# Patient Record
Sex: Male | Born: 1950 | Hispanic: No | Marital: Married
Health system: Southern US, Community
[De-identification: ages and names within clinical notes are randomized; demographics above are authoritative.]

---

## 1999-02-23 ENCOUNTER — Observation Stay (HOSPITAL_COMMUNITY): Admission: RE | Admit: 1999-02-23 | Discharge: 1999-02-24 | Payer: Self-pay | Admitting: Orthopedic Surgery

## 2003-12-02 ENCOUNTER — Ambulatory Visit (HOSPITAL_COMMUNITY): Admission: RE | Admit: 2003-12-02 | Discharge: 2003-12-03 | Payer: Self-pay | Admitting: Neurological Surgery

## 2019-09-21 DIAGNOSIS — Z6836 Body mass index (BMI) 36.0-36.9, adult: Secondary | ICD-10-CM | POA: Diagnosis not present

## 2019-09-21 DIAGNOSIS — Z1331 Encounter for screening for depression: Secondary | ICD-10-CM | POA: Diagnosis not present

## 2019-09-21 DIAGNOSIS — Z125 Encounter for screening for malignant neoplasm of prostate: Secondary | ICD-10-CM | POA: Diagnosis not present

## 2019-09-21 DIAGNOSIS — E785 Hyperlipidemia, unspecified: Secondary | ICD-10-CM | POA: Diagnosis not present

## 2019-09-21 DIAGNOSIS — Z9181 History of falling: Secondary | ICD-10-CM | POA: Diagnosis not present

## 2019-09-21 DIAGNOSIS — Z0000 Encounter for general adult medical examination without abnormal findings: Secondary | ICD-10-CM | POA: Diagnosis not present

## 2019-09-21 DIAGNOSIS — Z139 Encounter for screening, unspecified: Secondary | ICD-10-CM | POA: Diagnosis not present

## 2020-08-01 DIAGNOSIS — Z23 Encounter for immunization: Secondary | ICD-10-CM | POA: Diagnosis not present

## 2020-10-12 DIAGNOSIS — Z1331 Encounter for screening for depression: Secondary | ICD-10-CM | POA: Diagnosis not present

## 2020-10-12 DIAGNOSIS — Z139 Encounter for screening, unspecified: Secondary | ICD-10-CM | POA: Diagnosis not present

## 2020-10-12 DIAGNOSIS — E669 Obesity, unspecified: Secondary | ICD-10-CM | POA: Diagnosis not present

## 2020-10-12 DIAGNOSIS — E785 Hyperlipidemia, unspecified: Secondary | ICD-10-CM | POA: Diagnosis not present

## 2020-10-12 DIAGNOSIS — Z0000 Encounter for general adult medical examination without abnormal findings: Secondary | ICD-10-CM | POA: Diagnosis not present

## 2020-10-12 DIAGNOSIS — Z9181 History of falling: Secondary | ICD-10-CM | POA: Diagnosis not present

## 2020-10-17 DIAGNOSIS — K760 Fatty (change of) liver, not elsewhere classified: Secondary | ICD-10-CM | POA: Diagnosis not present

## 2020-10-17 DIAGNOSIS — R109 Unspecified abdominal pain: Secondary | ICD-10-CM | POA: Diagnosis not present

## 2020-10-17 DIAGNOSIS — R1031 Right lower quadrant pain: Secondary | ICD-10-CM | POA: Diagnosis not present

## 2020-10-31 DIAGNOSIS — E1169 Type 2 diabetes mellitus with other specified complication: Secondary | ICD-10-CM | POA: Diagnosis not present

## 2020-10-31 DIAGNOSIS — Z6837 Body mass index (BMI) 37.0-37.9, adult: Secondary | ICD-10-CM | POA: Diagnosis not present

## 2020-10-31 DIAGNOSIS — E785 Hyperlipidemia, unspecified: Secondary | ICD-10-CM | POA: Diagnosis not present

## 2020-10-31 DIAGNOSIS — Z125 Encounter for screening for malignant neoplasm of prostate: Secondary | ICD-10-CM | POA: Diagnosis not present

## 2020-10-31 DIAGNOSIS — Z1211 Encounter for screening for malignant neoplasm of colon: Secondary | ICD-10-CM | POA: Diagnosis not present

## 2020-10-31 DIAGNOSIS — I10 Essential (primary) hypertension: Secondary | ICD-10-CM | POA: Diagnosis not present

## 2020-10-31 DIAGNOSIS — K219 Gastro-esophageal reflux disease without esophagitis: Secondary | ICD-10-CM | POA: Diagnosis not present

## 2021-02-22 DIAGNOSIS — Z86010 Personal history of colonic polyps: Secondary | ICD-10-CM | POA: Diagnosis not present

## 2021-02-22 DIAGNOSIS — Z800 Family history of malignant neoplasm of digestive organs: Secondary | ICD-10-CM | POA: Diagnosis not present

## 2021-02-22 DIAGNOSIS — Z1211 Encounter for screening for malignant neoplasm of colon: Secondary | ICD-10-CM | POA: Diagnosis not present

## 2021-04-14 DIAGNOSIS — E785 Hyperlipidemia, unspecified: Secondary | ICD-10-CM | POA: Diagnosis not present

## 2021-04-14 DIAGNOSIS — E119 Type 2 diabetes mellitus without complications: Secondary | ICD-10-CM | POA: Diagnosis not present

## 2021-04-14 DIAGNOSIS — Z809 Family history of malignant neoplasm, unspecified: Secondary | ICD-10-CM | POA: Diagnosis not present

## 2021-04-14 DIAGNOSIS — K219 Gastro-esophageal reflux disease without esophagitis: Secondary | ICD-10-CM | POA: Diagnosis not present

## 2021-04-14 DIAGNOSIS — I10 Essential (primary) hypertension: Secondary | ICD-10-CM | POA: Diagnosis not present

## 2021-04-14 DIAGNOSIS — Z6836 Body mass index (BMI) 36.0-36.9, adult: Secondary | ICD-10-CM | POA: Diagnosis not present

## 2021-05-03 DIAGNOSIS — Z6836 Body mass index (BMI) 36.0-36.9, adult: Secondary | ICD-10-CM | POA: Diagnosis not present

## 2021-05-03 DIAGNOSIS — I10 Essential (primary) hypertension: Secondary | ICD-10-CM | POA: Diagnosis not present

## 2021-05-03 DIAGNOSIS — K219 Gastro-esophageal reflux disease without esophagitis: Secondary | ICD-10-CM | POA: Diagnosis not present

## 2021-05-03 DIAGNOSIS — E1169 Type 2 diabetes mellitus with other specified complication: Secondary | ICD-10-CM | POA: Diagnosis not present

## 2021-05-03 DIAGNOSIS — E785 Hyperlipidemia, unspecified: Secondary | ICD-10-CM | POA: Diagnosis not present

## 2021-10-31 DIAGNOSIS — Z6837 Body mass index (BMI) 37.0-37.9, adult: Secondary | ICD-10-CM | POA: Diagnosis not present

## 2021-10-31 DIAGNOSIS — E1169 Type 2 diabetes mellitus with other specified complication: Secondary | ICD-10-CM | POA: Diagnosis not present

## 2021-10-31 DIAGNOSIS — E785 Hyperlipidemia, unspecified: Secondary | ICD-10-CM | POA: Diagnosis not present

## 2021-10-31 DIAGNOSIS — I10 Essential (primary) hypertension: Secondary | ICD-10-CM | POA: Diagnosis not present

## 2021-10-31 DIAGNOSIS — M159 Polyosteoarthritis, unspecified: Secondary | ICD-10-CM | POA: Diagnosis not present

## 2021-10-31 DIAGNOSIS — Z125 Encounter for screening for malignant neoplasm of prostate: Secondary | ICD-10-CM | POA: Diagnosis not present

## 2021-10-31 DIAGNOSIS — Z139 Encounter for screening, unspecified: Secondary | ICD-10-CM | POA: Diagnosis not present

## 2021-10-31 DIAGNOSIS — K219 Gastro-esophageal reflux disease without esophagitis: Secondary | ICD-10-CM | POA: Diagnosis not present

## 2021-11-03 DIAGNOSIS — Z1331 Encounter for screening for depression: Secondary | ICD-10-CM | POA: Diagnosis not present

## 2021-11-03 DIAGNOSIS — E669 Obesity, unspecified: Secondary | ICD-10-CM | POA: Diagnosis not present

## 2021-11-03 DIAGNOSIS — Z6837 Body mass index (BMI) 37.0-37.9, adult: Secondary | ICD-10-CM | POA: Diagnosis not present

## 2021-11-03 DIAGNOSIS — Z9181 History of falling: Secondary | ICD-10-CM | POA: Diagnosis not present

## 2021-11-03 DIAGNOSIS — E785 Hyperlipidemia, unspecified: Secondary | ICD-10-CM | POA: Diagnosis not present

## 2021-11-03 DIAGNOSIS — Z0000 Encounter for general adult medical examination without abnormal findings: Secondary | ICD-10-CM | POA: Diagnosis not present

## 2022-09-05 ENCOUNTER — Ambulatory Visit: Payer: Medicare PPO

## 2022-09-05 ENCOUNTER — Ambulatory Visit: Payer: Medicare PPO | Admitting: Podiatry

## 2022-09-05 ENCOUNTER — Encounter: Payer: Self-pay | Admitting: Podiatry

## 2022-09-05 DIAGNOSIS — L600 Ingrowing nail: Secondary | ICD-10-CM | POA: Diagnosis not present

## 2022-09-05 DIAGNOSIS — I10 Essential (primary) hypertension: Secondary | ICD-10-CM | POA: Diagnosis not present

## 2022-09-05 DIAGNOSIS — S99921A Unspecified injury of right foot, initial encounter: Secondary | ICD-10-CM

## 2022-09-05 DIAGNOSIS — E1169 Type 2 diabetes mellitus with other specified complication: Secondary | ICD-10-CM | POA: Diagnosis not present

## 2022-09-05 DIAGNOSIS — K219 Gastro-esophageal reflux disease without esophagitis: Secondary | ICD-10-CM | POA: Diagnosis not present

## 2022-09-05 DIAGNOSIS — L02611 Cutaneous abscess of right foot: Secondary | ICD-10-CM | POA: Diagnosis not present

## 2022-09-05 DIAGNOSIS — E785 Hyperlipidemia, unspecified: Secondary | ICD-10-CM | POA: Diagnosis not present

## 2022-09-05 MED ORDER — SILVER SULFADIAZINE 1 % EX CREA: 1.0000 | TOPICAL_CREAM | Freq: Every day | CUTANEOUS | 0 refills | Status: AC

## 2022-09-05 NOTE — Addendum Note (Signed)
Addended by: Ammie Ferrier on: 09/05/2022 03:35 PM   Modules accepted: Orders

## 2022-09-05 NOTE — Addendum Note (Signed)
Addended by: Lorenda Peck R on: 09/05/2022 03:42 PM   Modules accepted: Orders

## 2022-09-05 NOTE — Progress Notes (Signed)
  Subjective:  Patient ID: Ryan Bates, male    DOB: 09/26/1950,   MRN: [id number]  Chief Complaint  Patient presents with   Ingrown Toenail    Ingrown to right hallux- Patient will start antibiotic PCP today.     73 y.o. male presents for for concern of right great ingrown toenail. Patient relates back in November he hit it and has been bothering him since. Relates he has used soaks silvadene and neosporin relates last few days it really blew up. Was seen by PCP and given clindamycin today. Has not started.  . Denies any other pedal complaints. Denies n/v/f/c.   History reviewed. No pertinent past medical history.  Objective:  Physical Exam: Vascular: DP/PT pulses 2/4 bilateral. CFT <3 seconds. Normal hair growth on digits. No edema.  Skin. No lacerations or abrasions bilateral feet. Right hallux lateral border incurvated with erythema edema and hypergranular tissue. Medial border incurvated and tendern no erythema and edema.  Musculoskeletal: MMT 5/5 bilateral lower extremities in DF, PF, Inversion and Eversion. Deceased ROM in DF of ankle joint.  Neurological: Sensation intact to light touch.   Assessment:   1. Ingrown right greater toenail      Plan:  Patient was evaluated and treated and all questions answered. Patient requesting removal of ingrown nail today. Procedure below.  Discussed procedure and post procedure care and patient expressed understanding.  Will follow-up in 2 weeks for nail check or sooner if any problems arise.    Procedure:  Procedure: partial Nail Avulsion of right hallux bilateral nail border.  Surgeon: Lorenda Peck, DPM  Pre-op Dx: Ingrown toenail with and without infection Post-op: Same  Place of Surgery: Office exam room.  Indications for surgery: Painful and ingrown toenail.    The patient is requesting removal of nail with and without chemical matrixectomy. Risks and complications were discussed with the patient for which they understand and  written consent was obtained. Under sterile conditions a total of 3 mL of  1% lidocaine plain was infiltrated in a hallux block fashion. Once anesthetized, the skin was prepped in sterile fashion. A tourniquet was then applied. Next the medial aspect of hallux nail border was then sharply excised making sure to remove the entire offending nail border.  Next phenol was then applied under standard conditions and copiously irrigated. Silvadene was applied. A dry sterile dressing was applied. After application of the dressing the tourniquet was removed and there is found to be an immediate capillary refill time to the digit. Procedure was repeated on the lateral border but without phenol. The patient tolerated the procedure well without any complications. Post procedure instructions were discussed the patient for which he verbally understood. Follow-up in two weeks for nail check or sooner if any problems are to arise. Discussed signs/symptoms of infection and directed to call the office immediately should any occur or go directly to the emergency room. In the meantime, encouraged to call the office with any questions, concerns, changes symptoms.   Lorenda Peck, DPM

## 2022-09-05 NOTE — Patient Instructions (Signed)

## 2022-09-19 ENCOUNTER — Encounter: Payer: Self-pay | Admitting: Podiatry

## 2022-09-19 ENCOUNTER — Ambulatory Visit: Payer: Medicare PPO | Admitting: Podiatry

## 2022-09-19 DIAGNOSIS — L600 Ingrowing nail: Secondary | ICD-10-CM

## 2022-09-19 DIAGNOSIS — Z23 Encounter for immunization: Secondary | ICD-10-CM | POA: Diagnosis not present

## 2022-09-19 DIAGNOSIS — E1169 Type 2 diabetes mellitus with other specified complication: Secondary | ICD-10-CM | POA: Diagnosis not present

## 2022-09-19 DIAGNOSIS — K219 Gastro-esophageal reflux disease without esophagitis: Secondary | ICD-10-CM | POA: Diagnosis not present

## 2022-09-19 DIAGNOSIS — E785 Hyperlipidemia, unspecified: Secondary | ICD-10-CM | POA: Diagnosis not present

## 2022-09-19 DIAGNOSIS — Z125 Encounter for screening for malignant neoplasm of prostate: Secondary | ICD-10-CM | POA: Diagnosis not present

## 2022-09-19 DIAGNOSIS — I10 Essential (primary) hypertension: Secondary | ICD-10-CM | POA: Diagnosis not present

## 2022-09-19 NOTE — Progress Notes (Signed)
  Subjective:  Patient ID: Ryan Bates, male    DOB: December 21, 1950,   MRN: [id number]  No chief complaint on file.   72 y.o. male presents for follow-up of right great ingrown toenail removal. Relates doing well. Has been soaking. Denies any pain.  . Denies any other pedal complaints. Denies n/v/f/c.   No past medical history on file.  Objective:  Physical Exam: Vascular: DP/PT pulses 2/4 bilateral. CFT <3 seconds. Normal hair growth on digits. No edema.  Skin. No lacerations or abrasions bilateral feet. Right hallux nail healing well.  Musculoskeletal: MMT 5/5 bilateral lower extremities in DF, PF, Inversion and Eversion. Deceased ROM in DF of ankle joint.  Neurological: Sensation intact to light touch.   Assessment:   1. Ingrown right greater toenail      Plan:  Patient was evaluated and treated and all questions answered. Toe was evaluated and appears to be healing well.  May discontinue soaks and neosporin.  Patient to follow-up as needed.    Lorenda Peck, DPM

## 2022-10-10 DIAGNOSIS — J208 Acute bronchitis due to other specified organisms: Secondary | ICD-10-CM | POA: Diagnosis not present

## 2022-10-10 DIAGNOSIS — M1611 Unilateral primary osteoarthritis, right hip: Secondary | ICD-10-CM | POA: Diagnosis not present

## 2022-12-19 DIAGNOSIS — Z6837 Body mass index (BMI) 37.0-37.9, adult: Secondary | ICD-10-CM | POA: Diagnosis not present

## 2022-12-19 DIAGNOSIS — E1169 Type 2 diabetes mellitus with other specified complication: Secondary | ICD-10-CM | POA: Diagnosis not present

## 2022-12-19 DIAGNOSIS — I10 Essential (primary) hypertension: Secondary | ICD-10-CM | POA: Diagnosis not present

## 2022-12-19 DIAGNOSIS — Z139 Encounter for screening, unspecified: Secondary | ICD-10-CM | POA: Diagnosis not present

## 2022-12-19 DIAGNOSIS — E785 Hyperlipidemia, unspecified: Secondary | ICD-10-CM | POA: Diagnosis not present

## 2022-12-19 DIAGNOSIS — K219 Gastro-esophageal reflux disease without esophagitis: Secondary | ICD-10-CM | POA: Diagnosis not present

## 2023-03-25 DIAGNOSIS — Z6837 Body mass index (BMI) 37.0-37.9, adult: Secondary | ICD-10-CM | POA: Diagnosis not present

## 2023-03-25 DIAGNOSIS — E785 Hyperlipidemia, unspecified: Secondary | ICD-10-CM | POA: Diagnosis not present

## 2023-03-25 DIAGNOSIS — E1169 Type 2 diabetes mellitus with other specified complication: Secondary | ICD-10-CM | POA: Diagnosis not present

## 2023-03-25 DIAGNOSIS — K219 Gastro-esophageal reflux disease without esophagitis: Secondary | ICD-10-CM | POA: Diagnosis not present

## 2023-03-25 DIAGNOSIS — I10 Essential (primary) hypertension: Secondary | ICD-10-CM | POA: Diagnosis not present

## 2023-07-03 DIAGNOSIS — E1169 Type 2 diabetes mellitus with other specified complication: Secondary | ICD-10-CM | POA: Diagnosis not present

## 2023-07-03 DIAGNOSIS — K219 Gastro-esophageal reflux disease without esophagitis: Secondary | ICD-10-CM | POA: Diagnosis not present

## 2023-07-03 DIAGNOSIS — I10 Essential (primary) hypertension: Secondary | ICD-10-CM | POA: Diagnosis not present

## 2023-07-03 DIAGNOSIS — Z23 Encounter for immunization: Secondary | ICD-10-CM | POA: Diagnosis not present

## 2023-07-03 DIAGNOSIS — E785 Hyperlipidemia, unspecified: Secondary | ICD-10-CM | POA: Diagnosis not present

## 2023-07-03 DIAGNOSIS — Z6837 Body mass index (BMI) 37.0-37.9, adult: Secondary | ICD-10-CM | POA: Diagnosis not present

## 2023-10-18 DIAGNOSIS — Z6837 Body mass index (BMI) 37.0-37.9, adult: Secondary | ICD-10-CM | POA: Diagnosis not present

## 2023-10-18 DIAGNOSIS — Z125 Encounter for screening for malignant neoplasm of prostate: Secondary | ICD-10-CM | POA: Diagnosis not present

## 2023-10-18 DIAGNOSIS — I10 Essential (primary) hypertension: Secondary | ICD-10-CM | POA: Diagnosis not present

## 2023-10-18 DIAGNOSIS — K219 Gastro-esophageal reflux disease without esophagitis: Secondary | ICD-10-CM | POA: Diagnosis not present

## 2023-10-18 DIAGNOSIS — E785 Hyperlipidemia, unspecified: Secondary | ICD-10-CM | POA: Diagnosis not present

## 2023-10-18 DIAGNOSIS — E1169 Type 2 diabetes mellitus with other specified complication: Secondary | ICD-10-CM | POA: Diagnosis not present

## 2024-01-30 DIAGNOSIS — E785 Hyperlipidemia, unspecified: Secondary | ICD-10-CM | POA: Diagnosis not present

## 2024-01-30 DIAGNOSIS — E1169 Type 2 diabetes mellitus with other specified complication: Secondary | ICD-10-CM | POA: Diagnosis not present

## 2024-01-30 DIAGNOSIS — I10 Essential (primary) hypertension: Secondary | ICD-10-CM | POA: Diagnosis not present

## 2024-01-30 DIAGNOSIS — K219 Gastro-esophageal reflux disease without esophagitis: Secondary | ICD-10-CM | POA: Diagnosis not present

## 2024-02-12 DIAGNOSIS — M160 Bilateral primary osteoarthritis of hip: Secondary | ICD-10-CM | POA: Diagnosis not present

## 2024-02-12 DIAGNOSIS — M25552 Pain in left hip: Secondary | ICD-10-CM | POA: Diagnosis not present

## 2024-02-12 DIAGNOSIS — M25551 Pain in right hip: Secondary | ICD-10-CM | POA: Diagnosis not present

## 2024-02-17 DIAGNOSIS — M1611 Unilateral primary osteoarthritis, right hip: Secondary | ICD-10-CM | POA: Diagnosis not present

## 2024-02-17 DIAGNOSIS — Z79899 Other long term (current) drug therapy: Secondary | ICD-10-CM | POA: Diagnosis not present

## 2024-02-17 DIAGNOSIS — K219 Gastro-esophageal reflux disease without esophagitis: Secondary | ICD-10-CM | POA: Diagnosis not present

## 2024-02-17 DIAGNOSIS — E119 Type 2 diabetes mellitus without complications: Secondary | ICD-10-CM | POA: Diagnosis not present

## 2024-02-17 DIAGNOSIS — I10 Essential (primary) hypertension: Secondary | ICD-10-CM | POA: Diagnosis not present

## 2024-02-17 DIAGNOSIS — Z01818 Encounter for other preprocedural examination: Secondary | ICD-10-CM | POA: Diagnosis not present

## 2024-03-05 DIAGNOSIS — Z4789 Encounter for other orthopedic aftercare: Secondary | ICD-10-CM | POA: Diagnosis not present

## 2024-03-11 DIAGNOSIS — M1612 Unilateral primary osteoarthritis, left hip: Secondary | ICD-10-CM | POA: Diagnosis not present

## 2024-03-11 DIAGNOSIS — E669 Obesity, unspecified: Secondary | ICD-10-CM | POA: Diagnosis not present

## 2024-03-11 DIAGNOSIS — I10 Essential (primary) hypertension: Secondary | ICD-10-CM | POA: Diagnosis not present

## 2024-03-11 DIAGNOSIS — M1611 Unilateral primary osteoarthritis, right hip: Secondary | ICD-10-CM | POA: Diagnosis not present

## 2024-03-11 DIAGNOSIS — Z96641 Presence of right artificial hip joint: Secondary | ICD-10-CM | POA: Diagnosis not present

## 2024-03-11 DIAGNOSIS — Z6838 Body mass index (BMI) 38.0-38.9, adult: Secondary | ICD-10-CM | POA: Diagnosis not present

## 2024-03-12 DIAGNOSIS — Z7984 Long term (current) use of oral hypoglycemic drugs: Secondary | ICD-10-CM | POA: Diagnosis not present

## 2024-03-12 DIAGNOSIS — Z471 Aftercare following joint replacement surgery: Secondary | ICD-10-CM | POA: Diagnosis not present

## 2024-03-12 DIAGNOSIS — E785 Hyperlipidemia, unspecified: Secondary | ICD-10-CM | POA: Diagnosis not present

## 2024-03-12 DIAGNOSIS — I10 Essential (primary) hypertension: Secondary | ICD-10-CM | POA: Diagnosis not present

## 2024-03-12 DIAGNOSIS — R7303 Prediabetes: Secondary | ICD-10-CM | POA: Diagnosis not present

## 2024-03-12 DIAGNOSIS — Z96641 Presence of right artificial hip joint: Secondary | ICD-10-CM | POA: Diagnosis not present

## 2024-03-12 DIAGNOSIS — Z7982 Long term (current) use of aspirin: Secondary | ICD-10-CM | POA: Diagnosis not present

## 2024-03-12 DIAGNOSIS — Z9181 History of falling: Secondary | ICD-10-CM | POA: Diagnosis not present

## 2024-03-13 DIAGNOSIS — Z7982 Long term (current) use of aspirin: Secondary | ICD-10-CM | POA: Diagnosis not present

## 2024-03-13 DIAGNOSIS — R7303 Prediabetes: Secondary | ICD-10-CM | POA: Diagnosis not present

## 2024-03-13 DIAGNOSIS — Z96641 Presence of right artificial hip joint: Secondary | ICD-10-CM | POA: Diagnosis not present

## 2024-03-13 DIAGNOSIS — Z471 Aftercare following joint replacement surgery: Secondary | ICD-10-CM | POA: Diagnosis not present

## 2024-03-13 DIAGNOSIS — Z9181 History of falling: Secondary | ICD-10-CM | POA: Diagnosis not present

## 2024-03-13 DIAGNOSIS — Z7984 Long term (current) use of oral hypoglycemic drugs: Secondary | ICD-10-CM | POA: Diagnosis not present

## 2024-03-13 DIAGNOSIS — I10 Essential (primary) hypertension: Secondary | ICD-10-CM | POA: Diagnosis not present

## 2024-03-13 DIAGNOSIS — E785 Hyperlipidemia, unspecified: Secondary | ICD-10-CM | POA: Diagnosis not present

## 2024-03-16 DIAGNOSIS — Z7982 Long term (current) use of aspirin: Secondary | ICD-10-CM | POA: Diagnosis not present

## 2024-03-16 DIAGNOSIS — E785 Hyperlipidemia, unspecified: Secondary | ICD-10-CM | POA: Diagnosis not present

## 2024-03-16 DIAGNOSIS — Z96641 Presence of right artificial hip joint: Secondary | ICD-10-CM | POA: Diagnosis not present

## 2024-03-16 DIAGNOSIS — R7303 Prediabetes: Secondary | ICD-10-CM | POA: Diagnosis not present

## 2024-03-16 DIAGNOSIS — I10 Essential (primary) hypertension: Secondary | ICD-10-CM | POA: Diagnosis not present

## 2024-03-16 DIAGNOSIS — Z471 Aftercare following joint replacement surgery: Secondary | ICD-10-CM | POA: Diagnosis not present

## 2024-03-16 DIAGNOSIS — Z9181 History of falling: Secondary | ICD-10-CM | POA: Diagnosis not present

## 2024-03-16 DIAGNOSIS — Z7984 Long term (current) use of oral hypoglycemic drugs: Secondary | ICD-10-CM | POA: Diagnosis not present

## 2024-03-18 DIAGNOSIS — I10 Essential (primary) hypertension: Secondary | ICD-10-CM | POA: Diagnosis not present

## 2024-03-18 DIAGNOSIS — Z7984 Long term (current) use of oral hypoglycemic drugs: Secondary | ICD-10-CM | POA: Diagnosis not present

## 2024-03-18 DIAGNOSIS — Z96641 Presence of right artificial hip joint: Secondary | ICD-10-CM | POA: Diagnosis not present

## 2024-03-18 DIAGNOSIS — E785 Hyperlipidemia, unspecified: Secondary | ICD-10-CM | POA: Diagnosis not present

## 2024-03-18 DIAGNOSIS — Z9181 History of falling: Secondary | ICD-10-CM | POA: Diagnosis not present

## 2024-03-18 DIAGNOSIS — Z471 Aftercare following joint replacement surgery: Secondary | ICD-10-CM | POA: Diagnosis not present

## 2024-03-18 DIAGNOSIS — Z7982 Long term (current) use of aspirin: Secondary | ICD-10-CM | POA: Diagnosis not present

## 2024-03-18 DIAGNOSIS — R7303 Prediabetes: Secondary | ICD-10-CM | POA: Diagnosis not present

## 2024-03-20 DIAGNOSIS — Z7982 Long term (current) use of aspirin: Secondary | ICD-10-CM | POA: Diagnosis not present

## 2024-03-20 DIAGNOSIS — R7303 Prediabetes: Secondary | ICD-10-CM | POA: Diagnosis not present

## 2024-03-20 DIAGNOSIS — Z9181 History of falling: Secondary | ICD-10-CM | POA: Diagnosis not present

## 2024-03-20 DIAGNOSIS — Z96641 Presence of right artificial hip joint: Secondary | ICD-10-CM | POA: Diagnosis not present

## 2024-03-20 DIAGNOSIS — E785 Hyperlipidemia, unspecified: Secondary | ICD-10-CM | POA: Diagnosis not present

## 2024-03-20 DIAGNOSIS — Z7984 Long term (current) use of oral hypoglycemic drugs: Secondary | ICD-10-CM | POA: Diagnosis not present

## 2024-03-20 DIAGNOSIS — Z471 Aftercare following joint replacement surgery: Secondary | ICD-10-CM | POA: Diagnosis not present

## 2024-03-20 DIAGNOSIS — I10 Essential (primary) hypertension: Secondary | ICD-10-CM | POA: Diagnosis not present

## 2024-03-23 DIAGNOSIS — Z7982 Long term (current) use of aspirin: Secondary | ICD-10-CM | POA: Diagnosis not present

## 2024-03-23 DIAGNOSIS — E785 Hyperlipidemia, unspecified: Secondary | ICD-10-CM | POA: Diagnosis not present

## 2024-03-23 DIAGNOSIS — Z7984 Long term (current) use of oral hypoglycemic drugs: Secondary | ICD-10-CM | POA: Diagnosis not present

## 2024-03-23 DIAGNOSIS — Z96641 Presence of right artificial hip joint: Secondary | ICD-10-CM | POA: Diagnosis not present

## 2024-03-23 DIAGNOSIS — Z9181 History of falling: Secondary | ICD-10-CM | POA: Diagnosis not present

## 2024-03-23 DIAGNOSIS — R7303 Prediabetes: Secondary | ICD-10-CM | POA: Diagnosis not present

## 2024-03-23 DIAGNOSIS — I10 Essential (primary) hypertension: Secondary | ICD-10-CM | POA: Diagnosis not present

## 2024-03-23 DIAGNOSIS — Z471 Aftercare following joint replacement surgery: Secondary | ICD-10-CM | POA: Diagnosis not present

## 2024-03-25 DIAGNOSIS — Z9181 History of falling: Secondary | ICD-10-CM | POA: Diagnosis not present

## 2024-03-25 DIAGNOSIS — Z471 Aftercare following joint replacement surgery: Secondary | ICD-10-CM | POA: Diagnosis not present

## 2024-03-25 DIAGNOSIS — R7303 Prediabetes: Secondary | ICD-10-CM | POA: Diagnosis not present

## 2024-03-25 DIAGNOSIS — Z7984 Long term (current) use of oral hypoglycemic drugs: Secondary | ICD-10-CM | POA: Diagnosis not present

## 2024-03-25 DIAGNOSIS — Z7982 Long term (current) use of aspirin: Secondary | ICD-10-CM | POA: Diagnosis not present

## 2024-03-25 DIAGNOSIS — I10 Essential (primary) hypertension: Secondary | ICD-10-CM | POA: Diagnosis not present

## 2024-03-25 DIAGNOSIS — E785 Hyperlipidemia, unspecified: Secondary | ICD-10-CM | POA: Diagnosis not present

## 2024-03-25 DIAGNOSIS — Z96641 Presence of right artificial hip joint: Secondary | ICD-10-CM | POA: Diagnosis not present

## 2024-03-30 DIAGNOSIS — R7303 Prediabetes: Secondary | ICD-10-CM | POA: Diagnosis not present

## 2024-03-30 DIAGNOSIS — E785 Hyperlipidemia, unspecified: Secondary | ICD-10-CM | POA: Diagnosis not present

## 2024-03-30 DIAGNOSIS — Z7984 Long term (current) use of oral hypoglycemic drugs: Secondary | ICD-10-CM | POA: Diagnosis not present

## 2024-03-30 DIAGNOSIS — I10 Essential (primary) hypertension: Secondary | ICD-10-CM | POA: Diagnosis not present

## 2024-03-30 DIAGNOSIS — Z9181 History of falling: Secondary | ICD-10-CM | POA: Diagnosis not present

## 2024-03-30 DIAGNOSIS — Z471 Aftercare following joint replacement surgery: Secondary | ICD-10-CM | POA: Diagnosis not present

## 2024-03-30 DIAGNOSIS — Z96641 Presence of right artificial hip joint: Secondary | ICD-10-CM | POA: Diagnosis not present

## 2024-03-30 DIAGNOSIS — Z7982 Long term (current) use of aspirin: Secondary | ICD-10-CM | POA: Diagnosis not present

## 2024-04-01 DIAGNOSIS — Z9181 History of falling: Secondary | ICD-10-CM | POA: Diagnosis not present

## 2024-04-01 DIAGNOSIS — Z96641 Presence of right artificial hip joint: Secondary | ICD-10-CM | POA: Diagnosis not present

## 2024-04-01 DIAGNOSIS — Z7982 Long term (current) use of aspirin: Secondary | ICD-10-CM | POA: Diagnosis not present

## 2024-04-01 DIAGNOSIS — Z471 Aftercare following joint replacement surgery: Secondary | ICD-10-CM | POA: Diagnosis not present

## 2024-04-01 DIAGNOSIS — I10 Essential (primary) hypertension: Secondary | ICD-10-CM | POA: Diagnosis not present

## 2024-04-01 DIAGNOSIS — Z7984 Long term (current) use of oral hypoglycemic drugs: Secondary | ICD-10-CM | POA: Diagnosis not present

## 2024-04-01 DIAGNOSIS — R7303 Prediabetes: Secondary | ICD-10-CM | POA: Diagnosis not present

## 2024-04-01 DIAGNOSIS — E785 Hyperlipidemia, unspecified: Secondary | ICD-10-CM | POA: Diagnosis not present

## 2024-04-23 DIAGNOSIS — Z96641 Presence of right artificial hip joint: Secondary | ICD-10-CM | POA: Diagnosis not present

## 2024-05-21 DIAGNOSIS — K219 Gastro-esophageal reflux disease without esophagitis: Secondary | ICD-10-CM | POA: Diagnosis not present

## 2024-05-21 DIAGNOSIS — Z1331 Encounter for screening for depression: Secondary | ICD-10-CM | POA: Diagnosis not present

## 2024-05-21 DIAGNOSIS — E785 Hyperlipidemia, unspecified: Secondary | ICD-10-CM | POA: Diagnosis not present

## 2024-05-21 DIAGNOSIS — I10 Essential (primary) hypertension: Secondary | ICD-10-CM | POA: Diagnosis not present

## 2024-05-21 DIAGNOSIS — E66812 Obesity, class 2: Secondary | ICD-10-CM | POA: Diagnosis not present

## 2024-05-21 DIAGNOSIS — E1169 Type 2 diabetes mellitus with other specified complication: Secondary | ICD-10-CM | POA: Diagnosis not present
